# Patient Record
Sex: Female | Born: 1955 | Race: Black or African American | Hispanic: No | Marital: Single | Smoking: Current every day smoker
Health system: Southern US, Community
[De-identification: ages and names within clinical notes are randomized; demographics above are authoritative.]

## PROBLEM LIST (undated history)

## (undated) DIAGNOSIS — F32A Depression, unspecified: Secondary | ICD-10-CM

## (undated) DIAGNOSIS — F329 Major depressive disorder, single episode, unspecified: Secondary | ICD-10-CM

---

## 2005-11-23 ENCOUNTER — Other Ambulatory Visit: Payer: Self-pay

## 2005-11-23 ENCOUNTER — Emergency Department: Payer: Self-pay

## 2006-04-13 ENCOUNTER — Emergency Department: Payer: Self-pay | Admitting: Internal Medicine

## 2006-06-02 ENCOUNTER — Emergency Department: Payer: Self-pay | Admitting: Emergency Medicine

## 2008-09-02 ENCOUNTER — Emergency Department: Payer: Self-pay | Admitting: Internal Medicine

## 2009-02-24 ENCOUNTER — Ambulatory Visit: Payer: Self-pay | Admitting: Internal Medicine

## 2012-06-20 ENCOUNTER — Ambulatory Visit: Payer: Self-pay

## 2013-08-24 ENCOUNTER — Emergency Department: Payer: Self-pay | Admitting: Emergency Medicine

## 2013-08-24 LAB — CBC WITH DIFFERENTIAL/PLATELET
BASOS PCT: 1.1 %
Basophil #: 0.1 10*3/uL (ref 0.0–0.1)
EOS ABS: 0.1 10*3/uL (ref 0.0–0.7)
Eosinophil %: 1.3 %
HCT: 40.3 % (ref 35.0–47.0)
HGB: 13.4 g/dL (ref 12.0–16.0)
LYMPHS PCT: 36.5 %
Lymphocyte #: 2.4 10*3/uL (ref 1.0–3.6)
MCH: 28.3 pg (ref 26.0–34.0)
MCHC: 33.4 g/dL (ref 32.0–36.0)
MCV: 85 fL (ref 80–100)
Monocyte #: 0.4 x10 3/mm (ref 0.2–0.9)
Monocyte %: 5.5 %
NEUTROS PCT: 55.6 %
Neutrophil #: 3.7 10*3/uL (ref 1.4–6.5)
PLATELETS: 307 10*3/uL (ref 150–440)
RBC: 4.76 10*6/uL (ref 3.80–5.20)
RDW: 14.1 % (ref 11.5–14.5)
WBC: 6.7 10*3/uL (ref 3.6–11.0)

## 2013-08-24 LAB — COMPREHENSIVE METABOLIC PANEL
ANION GAP: 6 — AB (ref 7–16)
Albumin: 3.4 g/dL (ref 3.4–5.0)
Alkaline Phosphatase: 94 U/L
BILIRUBIN TOTAL: 0.2 mg/dL (ref 0.2–1.0)
BUN: 12 mg/dL (ref 7–18)
CO2: 27 mmol/L (ref 21–32)
Calcium, Total: 9.3 mg/dL (ref 8.5–10.1)
Chloride: 105 mmol/L (ref 98–107)
Creatinine: 0.97 mg/dL (ref 0.60–1.30)
EGFR (African American): >60
EGFR (Non-African Amer.): >60
GLUCOSE: 82 mg/dL (ref 65–99)
OSMOLALITY: 275 (ref 275–301)
POTASSIUM: 4.1 mmol/L (ref 3.5–5.1)
SGOT(AST): 31 U/L (ref 15–37)
SGPT (ALT): 22 U/L (ref 12–78)
SODIUM: 138 mmol/L (ref 136–145)
TOTAL PROTEIN: 7.9 g/dL (ref 6.4–8.2)

## 2017-05-22 ENCOUNTER — Encounter: Payer: Self-pay | Admitting: Emergency Medicine

## 2017-05-22 ENCOUNTER — Emergency Department: Payer: Medicaid Other

## 2017-05-22 ENCOUNTER — Emergency Department
Admission: EM | Admit: 2017-05-22 | Discharge: 2017-05-22 | Disposition: A | Discharge status: Home / Self Care | Payer: Medicaid Other | Attending: Emergency Medicine | Admitting: Emergency Medicine

## 2017-05-22 ENCOUNTER — Other Ambulatory Visit: Payer: Self-pay

## 2017-05-22 DIAGNOSIS — T50905A Adverse effect of unspecified drugs, medicaments and biological substances, initial encounter: Secondary | ICD-10-CM

## 2017-05-22 DIAGNOSIS — F17200 Nicotine dependence, unspecified, uncomplicated: Secondary | ICD-10-CM | POA: Diagnosis not present

## 2017-05-22 DIAGNOSIS — R400 Somnolence: Secondary | ICD-10-CM

## 2017-05-22 DIAGNOSIS — T43595A Adverse effect of other antipsychotics and neuroleptics, initial encounter: Secondary | ICD-10-CM | POA: Diagnosis not present

## 2017-05-22 DIAGNOSIS — R4781 Slurred speech: Secondary | ICD-10-CM | POA: Insufficient documentation

## 2017-05-22 HISTORY — DX: Major depressive disorder, single episode, unspecified: F32.9

## 2017-05-22 HISTORY — DX: Depression, unspecified: F32.A

## 2017-05-22 LAB — TROPONIN I

## 2017-05-22 LAB — URINALYSIS, COMPLETE (UACMP) WITH MICROSCOPIC
BILIRUBIN URINE: NEGATIVE
Bacteria, UA: NONE SEEN
GLUCOSE, UA: NEGATIVE mg/dL
Hgb urine dipstick: NEGATIVE
KETONES UR: NEGATIVE mg/dL
LEUKOCYTES UA: NEGATIVE
NITRITE: NEGATIVE
PROTEIN: NEGATIVE mg/dL
SQUAMOUS EPITHELIAL / LPF: NONE SEEN
Specific Gravity, Urine: 1.005 (ref 1.005–1.030)
pH: 6 (ref 5.0–8.0)

## 2017-05-22 LAB — SALICYLATE LEVEL: Salicylate Lvl: <7 mg/dL (ref 2.8–30.0)

## 2017-05-22 LAB — COMPREHENSIVE METABOLIC PANEL
ALBUMIN: 4 g/dL (ref 3.5–5.0)
ALT: 15 U/L (ref 14–54)
ANION GAP: 8 (ref 5–15)
AST: 31 U/L (ref 15–41)
Alkaline Phosphatase: 82 U/L (ref 38–126)
BUN: 13 mg/dL (ref 6–20)
CO2: 26 mmol/L (ref 22–32)
Calcium: 9.2 mg/dL (ref 8.9–10.3)
Chloride: 105 mmol/L (ref 101–111)
Creatinine, Ser: 0.85 mg/dL (ref 0.44–1.00)
GFR calc Af Amer: >60 mL/min (ref >60)
GFR calc non Af Amer: >60 mL/min (ref >60)
GLUCOSE: 109 mg/dL — AB (ref 65–99)
POTASSIUM: 4.2 mmol/L (ref 3.5–5.1)
SODIUM: 139 mmol/L (ref 135–145)
TOTAL PROTEIN: 7.5 g/dL (ref 6.5–8.1)
Total Bilirubin: 0.6 mg/dL (ref 0.3–1.2)

## 2017-05-22 LAB — GLUCOSE, CAPILLARY: Glucose-Capillary: 104 mg/dL — ABNORMAL HIGH (ref 65–99)

## 2017-05-22 LAB — URINE DRUG SCREEN, QUALITATIVE (ARMC ONLY)
AMPHETAMINES, UR SCREEN: NOT DETECTED
BENZODIAZEPINE, UR SCRN: NOT DETECTED
Barbiturates, Ur Screen: NOT DETECTED
COCAINE METABOLITE, UR ~~LOC~~: NOT DETECTED
Cannabinoid 50 Ng, Ur ~~LOC~~: NOT DETECTED
MDMA (Ecstasy)Ur Screen: NOT DETECTED
METHADONE SCREEN, URINE: NOT DETECTED
OPIATE, UR SCREEN: NOT DETECTED
Phencyclidine (PCP) Ur S: NOT DETECTED
TRICYCLIC, UR SCREEN: NOT DETECTED

## 2017-05-22 LAB — CBC
HCT: 39.6 % (ref 35.0–47.0)
Hemoglobin: 12.9 g/dL (ref 12.0–16.0)
MCH: 27.6 pg (ref 26.0–34.0)
MCHC: 32.4 g/dL (ref 32.0–36.0)
MCV: 85 fL (ref 80.0–100.0)
Platelets: 308 10*3/uL (ref 150–440)
RBC: 4.66 MIL/uL (ref 3.80–5.20)
RDW: 15 % — AB (ref 11.5–14.5)
WBC: 5.4 10*3/uL (ref 3.6–11.0)

## 2017-05-22 LAB — ETHANOL: Alcohol, Ethyl (B): <10 mg/dL (ref <10)

## 2017-05-22 LAB — ACETAMINOPHEN LEVEL: Acetaminophen (Tylenol), Serum: <10 ug/mL — ABNORMAL LOW (ref 10–30)

## 2017-05-22 NOTE — Discharge Instructions (Addendum)
It was a pleasure to take care of you today, and thank you for coming to our emergency department.  If you have any questions or concerns before leaving please ask the nurse to grab me and I'm more than happy to go through your aftercare instructions again.  If you were prescribed any opioid pain medication today such as Norco, Vicodin, Percocet, morphine, hydrocodone, or oxycodone please make sure you do not drive when you are taking this medication as it can alter your ability to drive safely.  If you have any concerns once you are home that you are not improving or are in fact getting worse before you can make it to your follow-up appointment, please do not hesitate to call 911 and come back for further evaluation.   Results for orders placed or performed during the hospital encounter of 05/22/17  CBC  Result Value Ref Range   WBC 5.4 3.6 - 11.0 K/uL   RBC 4.66 3.80 - 5.20 MIL/uL   Hemoglobin 12.9 12.0 - 16.0 g/dL   HCT 16.1 09.6 - 04.5 %   MCV 85.0 80.0 - 100.0 fL   MCH 27.6 26.0 - 34.0 pg   MCHC 32.4 32.0 - 36.0 g/dL   RDW 40.9 (H) 81.1 - 91.4 %   Platelets 308 150 - 440 K/uL  Comprehensive metabolic panel  Result Value Ref Range   Sodium 139 135 - 145 mmol/L   Potassium 4.2 3.5 - 5.1 mmol/L   Chloride 105 101 - 111 mmol/L   CO2 26 22 - 32 mmol/L   Glucose, Bld 109 (H) 65 - 99 mg/dL   BUN 13 6 - 20 mg/dL   Creatinine, Ser 7.82 0.44 - 1.00 mg/dL   Calcium 9.2 8.9 - 95.6 mg/dL   Total Protein 7.5 6.5 - 8.1 g/dL   Albumin 4.0 3.5 - 5.0 g/dL   AST 31 15 - 41 U/L   ALT 15 14 - 54 U/L   Alkaline Phosphatase 82 38 - 126 U/L   Total Bilirubin 0.6 0.3 - 1.2 mg/dL   GFR calc non Af Amer >60 >60 mL/min   GFR calc Af Amer >60 >60 mL/min   Anion gap 8 5 - 15  Ethanol  Result Value Ref Range   Alcohol, Ethyl (B) <10 <10 mg/dL  Urine Drug Screen, Qualitative ([facility] only)  Result Value Ref Range   Tricyclic, Ur Screen NONE DETECTED NONE DETECTED   Amphetamines, Ur Screen NONE DETECTED  NONE DETECTED   MDMA (Ecstasy)Ur Screen NONE DETECTED NONE DETECTED   Cocaine Metabolite,Ur [place] NONE DETECTED NONE DETECTED   Opiate, Ur Screen NONE DETECTED NONE DETECTED   Phencyclidine (PCP) Ur S NONE DETECTED NONE DETECTED   Cannabinoid 50 Ng, Ur [place] NONE DETECTED NONE DETECTED   Barbiturates, Ur Screen NONE DETECTED NONE DETECTED   Benzodiazepine, Ur Scrn NONE DETECTED NONE DETECTED   Methadone Scn, Ur NONE DETECTED NONE DETECTED  Urinalysis, Complete w Microscopic  Result Value Ref Range   Color, Urine STRAW (A) YELLOW   APPearance CLEAR (A) CLEAR   Specific Gravity, Urine 1.005 1.005 - 1.030   pH 6.0 5.0 - 8.0   Glucose, UA NEGATIVE NEGATIVE mg/dL   Hgb urine dipstick NEGATIVE NEGATIVE   Bilirubin Urine NEGATIVE NEGATIVE   Ketones, ur NEGATIVE NEGATIVE mg/dL   Protein, ur NEGATIVE NEGATIVE mg/dL   Nitrite NEGATIVE NEGATIVE   Leukocytes, UA NEGATIVE NEGATIVE   RBC / HPF 0-5 0 - 5 RBC/hpf   WBC, UA 0-5  0 - 5 WBC/hpf   Bacteria, UA NONE SEEN NONE SEEN   Squamous Epithelial / LPF NONE SEEN NONE SEEN  Salicylate level  Result Value Ref Range   Salicylate Lvl <7.0 2.8 - 30.0 mg/dL  Acetaminophen level  Result Value Ref Range   Acetaminophen (Tylenol), Serum <10 (L) 10 - 30 ug/mL  Troponin I  Result Value Ref Range   Troponin I <0.03 <0.03 ng/mL  Glucose, capillary  Result Value Ref Range   Glucose-Capillary 104 (H) 65 - 99 mg/dL   Ct Head Wo Contrast  Result Date: 05/22/2017 CLINICAL DATA:  Altered level of consciousness. Somnolence and slurred speech. EXAM: CT HEAD WITHOUT CONTRAST TECHNIQUE: Contiguous axial images were obtained from the base of the skull through the vertex without intravenous contrast. COMPARISON:  None. FINDINGS: Brain: Mild diffuse cerebral atrophy. No ventricular dilatation. No evidence of acute infarction, hemorrhage, hydrocephalus, extra-axial collection or mass lesion/mass effect. Vascular: No hyperdense vessel or unexpected calcification. Skull:  Normal. Negative for fracture or focal lesion. Sinuses/Orbits: No acute finding. Other: None. IMPRESSION: 1. No acute intracranial abnormalities.  Mild cortical atrophy. Electronically Signed   By: Burman NievesWilliam  Stevens M.D.   On: 05/22/2017 06:19   Dg Chest Portable 1 View  Result Date: 05/22/2017 CLINICAL DATA:  Cough and some in a once EXAM: PORTABLE CHEST 1 VIEW COMPARISON:  06/01/2016 FINDINGS: No acute airspace disease or pleural effusion. Stable cardiomediastinal silhouette. No pneumothorax. Scoliosis of the spine. IMPRESSION: No active disease. Electronically Signed   By: Jasmine PangKim  Fujinaga M.D.   On: 05/22/2017 00:56

## 2017-05-22 NOTE — ED Provider Notes (Signed)
[facility] Emergency Department Provider Note   First MD Initiated Contact with Patient 05/22/17 0030     (approximate)  I have reviewed the triage vital signs and the nursing notes.   HISTORY  Chief Complaint Medication Reaction    HPI Ruth Brennan is a 62 y.o. female presents to the emergency department via EMS secondary to somnolence slurred speech.  Patient was [facility] today and prescribed Seroquel 100 mg which did patient states that she took her first dose tonight.  Patient denies any other substance ingestion.  Patient states difficulty trying to stay awake.  Patient denies any weakness no numbness no visual changes.   Past Medical History:  Diagnosis Date  . Depression     There are no active problems to display for this patient.   Past Surgical History:  Procedure Laterality Date  . CESAREAN SECTION      Prior to Admission medications   Not on File    Allergies Penicillins  No family history on file.  Social History Social History   Tobacco Use  . Smoking status: Current Every Day Smoker  . Smokeless tobacco: Never Used  Substance Use Topics  . Alcohol use: Yes  . Drug use: Not on file    Review of Systems Constitutional: No fever/chills Eyes: No visual changes. ENT: No sore throat. Cardiovascular: Denies chest pain. Respiratory: Denies shortness of breath. Gastrointestinal: No abdominal pain.  No nausea, no vomiting.  No diarrhea.  No constipation. Genitourinary: Negative for dysuria. Musculoskeletal: Negative for neck pain.  Negative for back pain. Integumentary: Negative for rash. Neurological: Negative for headaches, focal weakness or numbness.  Positive for somnolence  ____________________________________________   PHYSICAL EXAM:  VITAL SIGNS: ED Triage Vitals [05/22/17 0019]  Enc Vitals Group     BP 123/84     Pulse Rate 66     Resp 18     Temp      Temp src      SpO2 100 %     Weight  52.2 kg (115 lb)     Height 1.651 m (5\' 5" )     Head Circumference      Peak Flow      Pain Score 0     Pain Loc      Pain Edu?      Excl. in GC?     Constitutional: Very somnolent but arousable to verbal stimuli.  Patient repetitively falling asleep eyes: Conjunctivae are normal. PERRL. EOMI. Head: Atraumatic. Mouth/Throat: Mucous membranes are moist.  Oropharynx non-erythematous. Neck: No stridor.  Cardiovascular: Normal rate, regular rhythm. Good peripheral circulation. Grossly normal heart sounds. Respiratory: Normal respiratory effort.  No retractions. Lungs CTAB. Gastrointestinal: Soft and nontender. No distention.  Musculoskeletal: No lower extremity tenderness nor edema. No gross deformities of extremities. Neurologic:  Normal speech and language. No gross focal neurologic deficits are appreciated.  Skin:  Skin is warm, dry and intact. No rash noted. Psychiatric: Mood and affect are normal. Speech and behavior are normal.  ____________________________________________   LABS (all labs ordered are listed, but only abnormal results are displayed)  Labs Reviewed  CBC - Abnormal; Notable for the following components:      Result Value   RDW 15.0 (*)    All other components within normal limits  COMPREHENSIVE METABOLIC PANEL - Abnormal; Notable for the following components:   Glucose, Bld 109 (*)    All other components within normal limits  URINALYSIS, COMPLETE (UACMP) WITH  MICROSCOPIC - Abnormal; Notable for the following components:   Color, Urine STRAW (*)    APPearance CLEAR (*)    All other components within normal limits  ACETAMINOPHEN LEVEL - Abnormal; Notable for the following components:   Acetaminophen (Tylenol), Serum <10 (*)    All other components within normal limits  GLUCOSE, CAPILLARY - Abnormal; Notable for the following components:   Glucose-Capillary 104 (*)    All other components within normal limits  ETHANOL  URINE DRUG SCREEN, QUALITATIVE ([facility]  ONLY)  SALICYLATE LEVEL  TROPONIN I  CBG MONITORING, ED   ____________________________________________  EKG  ED ECG REPORT I, [place] N Antwoine Zorn, the attending physician, personally viewed and interpreted this ECG.   Date: 05/22/2017  EKG Time: 12:21 AM  Rate: 62  Rhythm: Normal sinus rhythm  Axis: Normal  Intervals: Normal  ST&T Change: None  ____________________________________________  RADIOLOGY I, [place] N Rondo Spittler, personally viewed and evaluated these images (plain radiographs) as part of my medical decision making, as well as reviewing the written report by the radiologist.  ED MD interpretation: Chest x-ray unremarkable likewise CT scan of the head revealed no acute intracranial abnormalities as per radiologist.  Official radiology report(s): Ct Head Wo Contrast  Result Date: 05/22/2017 CLINICAL DATA:  Altered level of consciousness. Somnolence and slurred speech. EXAM: CT HEAD WITHOUT CONTRAST TECHNIQUE: Contiguous axial images were obtained from the base of the skull through the vertex without intravenous contrast. COMPARISON:  None. FINDINGS: Brain: Mild diffuse cerebral atrophy. No ventricular dilatation. No evidence of acute infarction, hemorrhage, hydrocephalus, extra-axial collection or mass lesion/mass effect. Vascular: No hyperdense vessel or unexpected calcification. Skull: Normal. Negative for fracture or focal lesion. Sinuses/Orbits: No acute finding. Other: None. IMPRESSION: 1. No acute intracranial abnormalities.  Mild cortical atrophy. Electronically Signed   By: Burman Nieves M.D.   On: 05/22/2017 06:19   Dg Chest Portable 1 View  Result Date: 05/22/2017 CLINICAL DATA:  Cough and some in a once EXAM: PORTABLE CHEST 1 VIEW COMPARISON:  06/01/2016 FINDINGS: No acute airspace disease or pleural effusion. Stable cardiomediastinal silhouette. No pneumothorax. Scoliosis of the spine. IMPRESSION: No active disease. Electronically Signed   By: Jasmine Pang M.D.   On:  05/22/2017 00:56    Procedures   ____________________________________________   INITIAL IMPRESSION / ASSESSMENT AND PLAN / ED COURSE  As part of my medical decision making, I reviewed the following data within the electronic MEDICAL RECORD NUMBER   62 year old female presented with above-stated history and physical exam which I suspect to be secondary to Seroquel ingestion.  However consider the possibility of toxin mediated etiology however laboratory data completely unremarkable.  CT scan of the head was obtained to evaluate for intracranial abnormality including hemorrhage/CVA however CT was also normal.  As such I suspect the patient's somnolence is secondary to Seroquel patient will be allowed to sleep in the emergency department reevaluated before discharge.  __________________________________  FINAL CLINICAL IMPRESSION(S) / ED DIAGNOSES  Final diagnoses:  Somnolence  Medication reaction, initial encounter     MEDICATIONS GIVEN DURING THIS VISIT:  Medications - No data to display   ED Discharge Orders    None       Note:  This document was prepared using Dragon voice recognition software and may include unintentional dictation errors.    Darci Current, MD 05/22/17 [phone]

## 2017-05-22 NOTE — ED Triage Notes (Addendum)
Pt to room 3 via w/c, brought in by EMS due to somnlence; pt with eyes closed, slurred speech; EMS st seen [facility] today and rx seroquel 100mg ; pt st that she took it at 3pm and denies any other meds or substances taken as well; pt falling asleep freq during triage; placed in hosp gown & on card monitor; pt st "when I laid down I thought I was gonna die"

## 2017-05-22 NOTE — ED Notes (Addendum)
Pt awake and alert, pt drinking water at this time. PT in NAD , VSS . Family at bedside

## 2017-05-22 NOTE — ED Notes (Signed)
Patient transported to CT 

## 2017-05-22 NOTE — ED Notes (Signed)
Patient is resting comfortably at this time with no signs of distress present. Equal, unlabored rise and fall of chest noted within normal rate. VS stable. Family remains at bedside. Will continue to monitor.

## 2019-05-18 ENCOUNTER — Ambulatory Visit: Payer: Medicaid Other | Attending: Internal Medicine

## 2019-05-18 DIAGNOSIS — Z23 Encounter for immunization: Secondary | ICD-10-CM

## 2019-05-18 NOTE — Progress Notes (Signed)
   Covid-19 Vaccination Clinic  Name:  Ruth Brennan    MRN: [id number] DOB: April 26, 1955  05/18/2019  [name] was observed post Covid-19 immunization for 15 minutes without incident. She was provided with Vaccine Information Sheet and instruction to access the V-Safe system.   [name] was instructed to call 911 with any severe reactions post vaccine: Marland Kitchen Difficulty breathing  . Swelling of face and throat  . A fast heartbeat  . A bad rash all over body  . Dizziness and weakness   Immunizations Administered    Name Date Dose VIS Date Route   Pfizer COVID-19 Vaccine 05/18/2019 12:07 PM 0.3 mL 01/30/2019 Intramuscular   Manufacturer: [employer], [employer]   Lot: [id number]   NDC: [address]-9

## 2019-06-09 ENCOUNTER — Ambulatory Visit: Payer: Medicaid Other | Attending: Internal Medicine

## 2019-06-09 DIAGNOSIS — Z23 Encounter for immunization: Secondary | ICD-10-CM

## 2019-06-09 NOTE — Progress Notes (Signed)
   Covid-19 Vaccination Clinic  Name:  Ruth Brennan    MRN: [id number] DOB: Aug 23, 1955  06/09/2019  [name] was observed post Covid-19 immunization for 30 minutes based on pre-vaccination screening without incident. She was provided with Vaccine Information Sheet and instruction to access the V-Safe system.   [name] was instructed to call 911 with any severe reactions post vaccine: Marland Kitchen Difficulty breathing  . Swelling of face and throat  . A fast heartbeat  . A bad rash all over body  . Dizziness and weakness   Immunizations Administered    Name Date Dose VIS Date Route   Pfizer COVID-19 Vaccine 06/09/2019 12:59 PM 0.3 mL 04/15/2018 Intramuscular   Manufacturer: [employer], [employer]   Lot: [id number]   NDC: [address]-3

## 2019-06-29 IMAGING — DX DG CHEST 1V PORT
1 series · 1 of 1 positions shown · non-contrast
Comparison: 06/01/2016

CLINICAL DATA: Cough and some in a once

EXAM:
PORTABLE CHEST 1 VIEW

[chest ap]
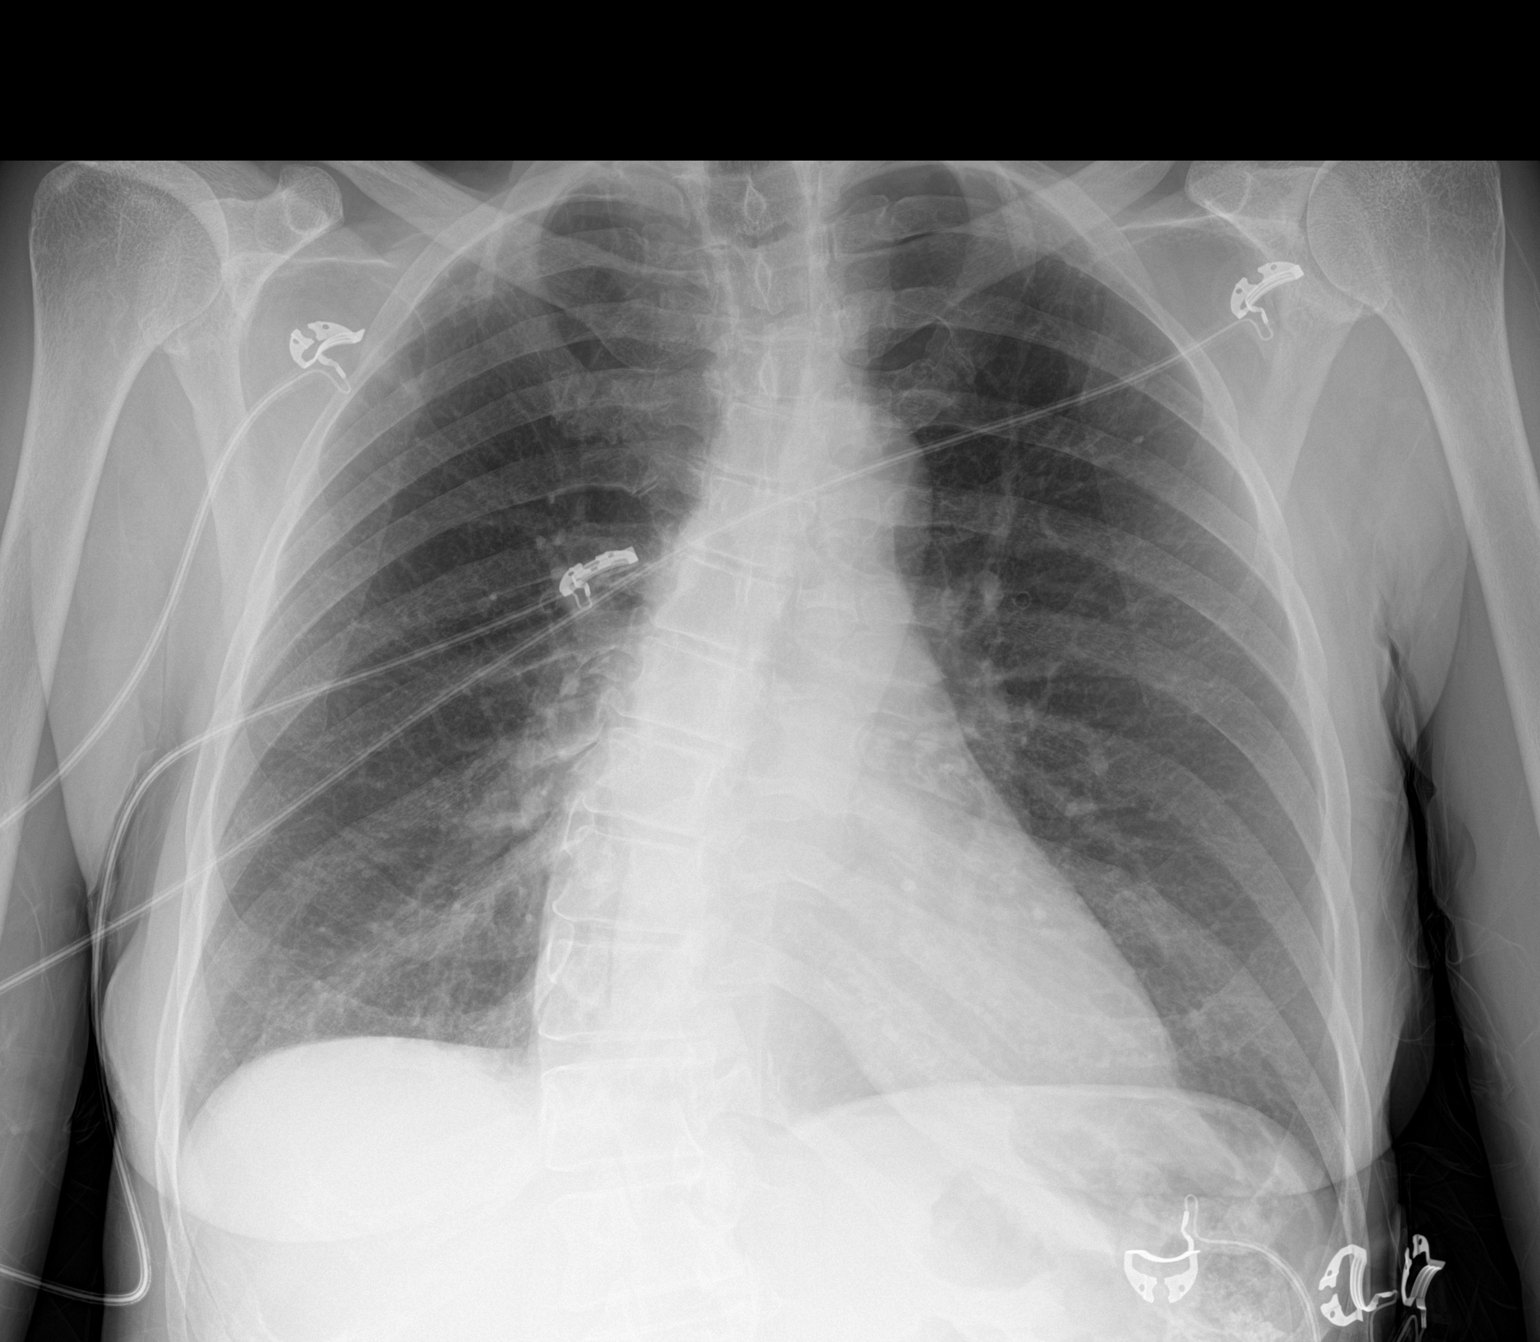

[1 of 1 positions shown; findings below may reference images not displayed]

FINDINGS: No acute airspace disease or pleural effusion. Stable
cardiomediastinal silhouette. No pneumothorax. Scoliosis of the
spine.
IMPRESSION: No active disease.

## 2019-06-29 IMAGING — CT CT HEAD W/O CM
3 series · 16 of 45 positions shown, 19 images · non-contrast
Comparison: None.

CLINICAL DATA: Altered level of consciousness. Somnolence and
slurred speech.

EXAM:
CT HEAD WITHOUT CONTRAST
TECHNIQUE: Contiguous axial images were obtained from the base of the skull
through the vertex without intravenous contrast.

[Series 2: head wo · axial · 0.39mm/px · z∈[-106,+9]mm · 10 of 28 slices shown, 13 images]
[im 3/28  brain]
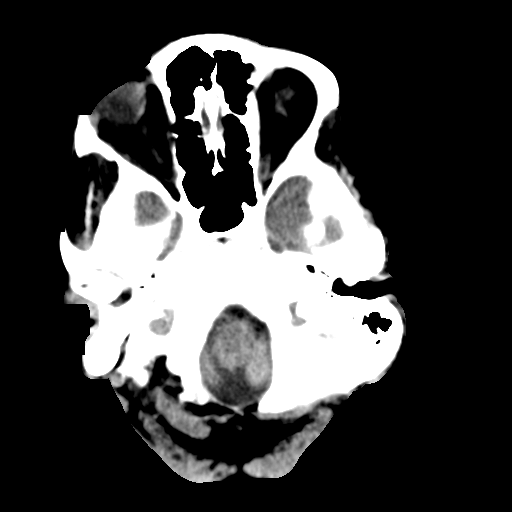
[im 3/28  bone]
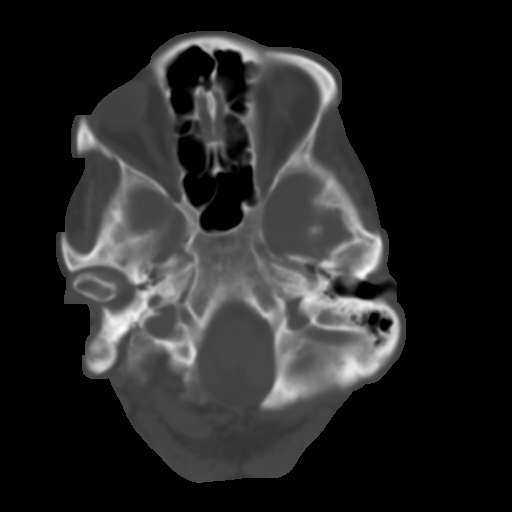
[im 5/28  brain]
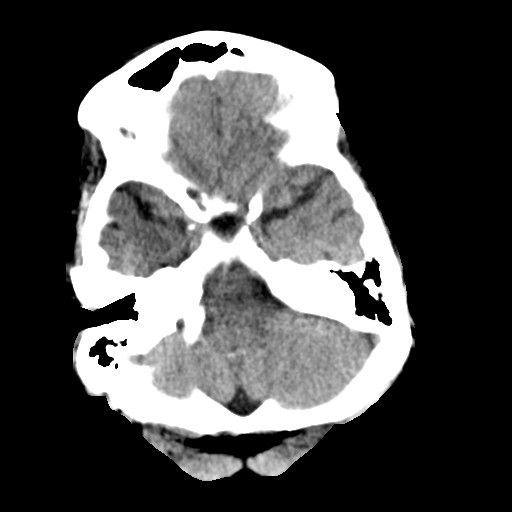
[im 8/28  brain]
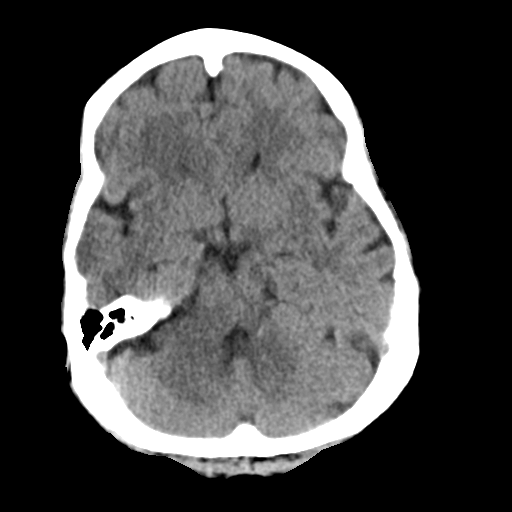
[im 11/28  brain]
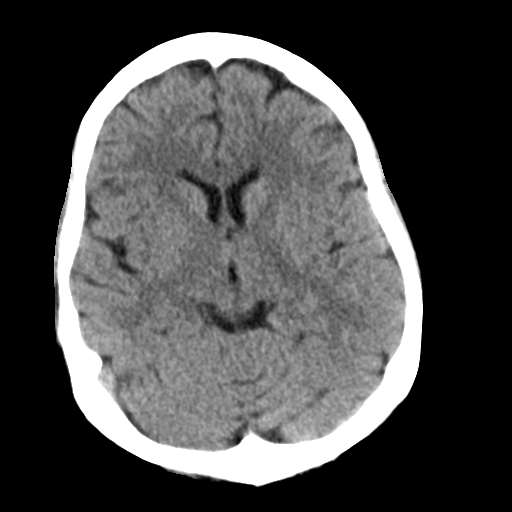
[im 13/28  brain]
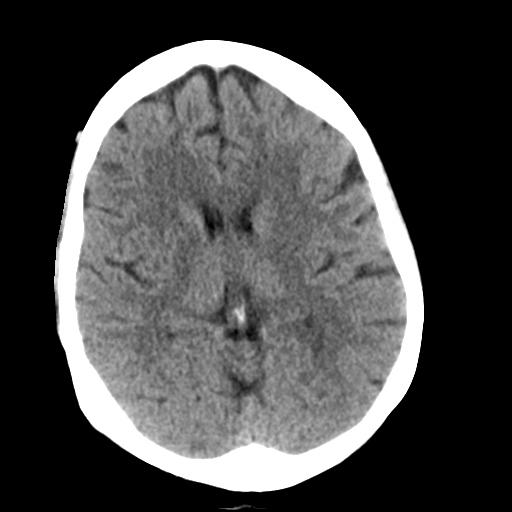
[im 13/28  bone]
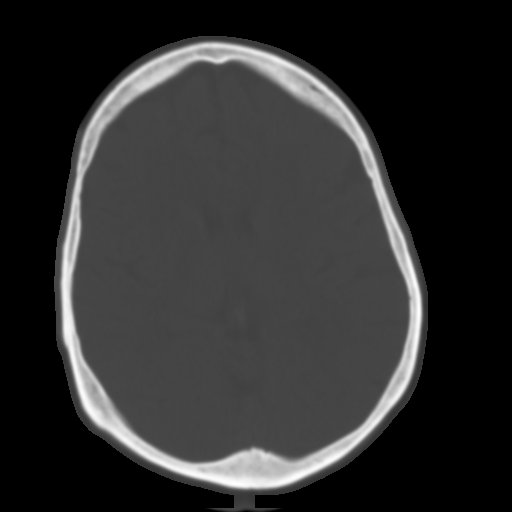
[im 16/28  brain]
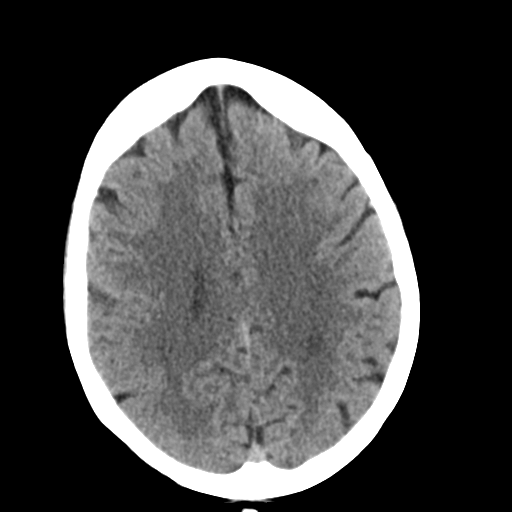
[im 18/28  brain]
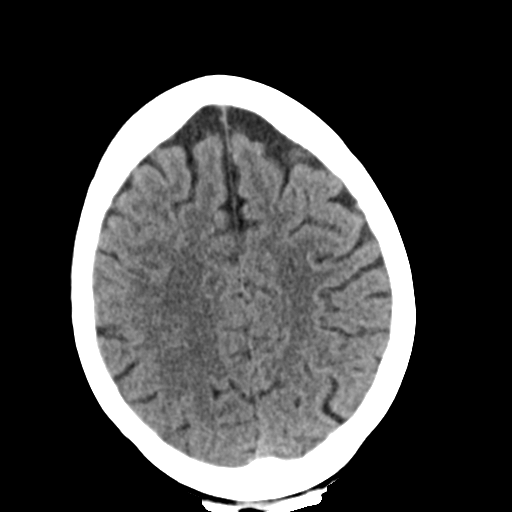
[im 21/28  brain]
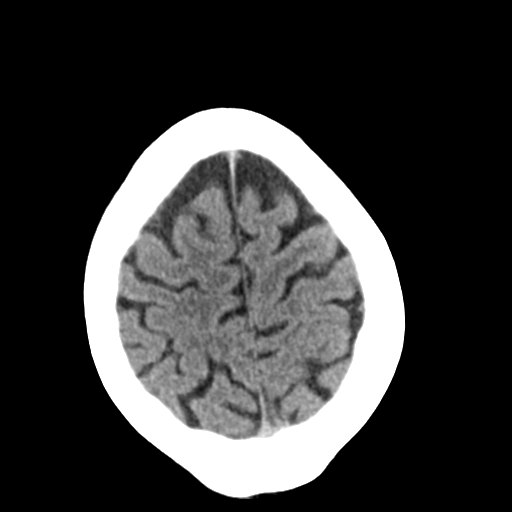
[im 24/28  brain]
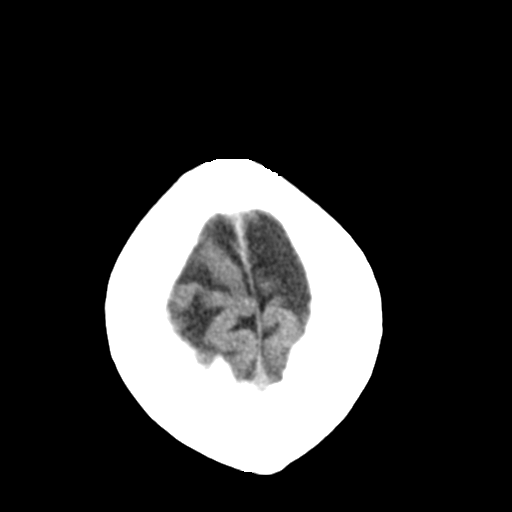
[im 24/28  bone]
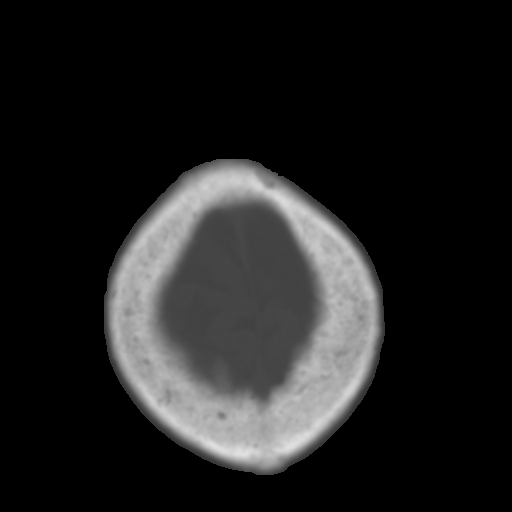
[im 26/28  brain]
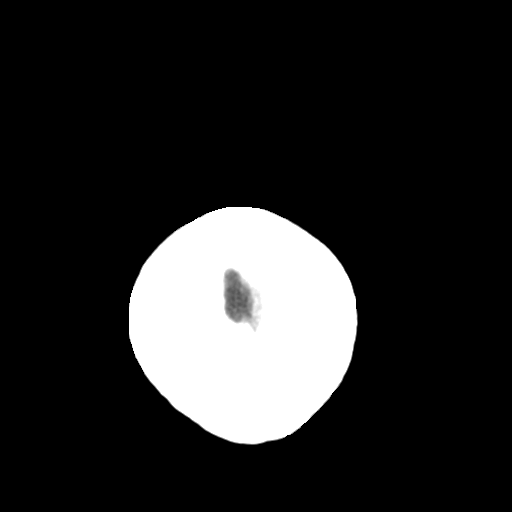

[Series 4: coronal soft tissue · coronal · 0.29mm/px · 3 of 60 slices shown]
[im 20/60  brain]
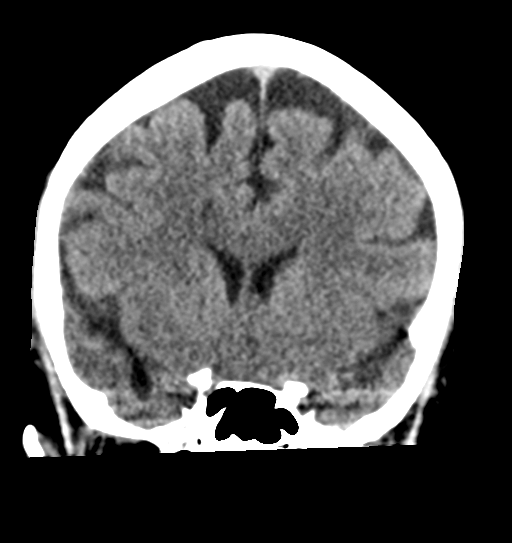
[im 27/60  brain]
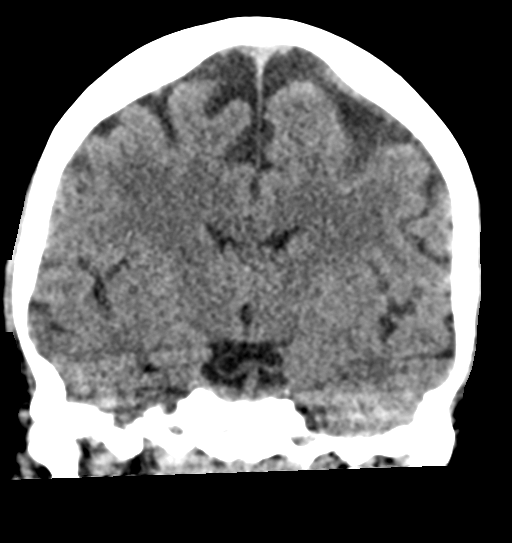
[im 33/60  brain]
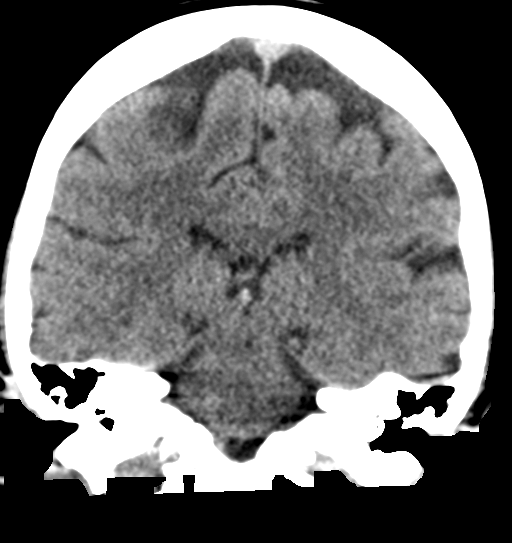

[Series 5: sagittal soft tissue · sagittal · 0.30mm/px · 3 of 45 slices shown]
[im 15/45  brain]
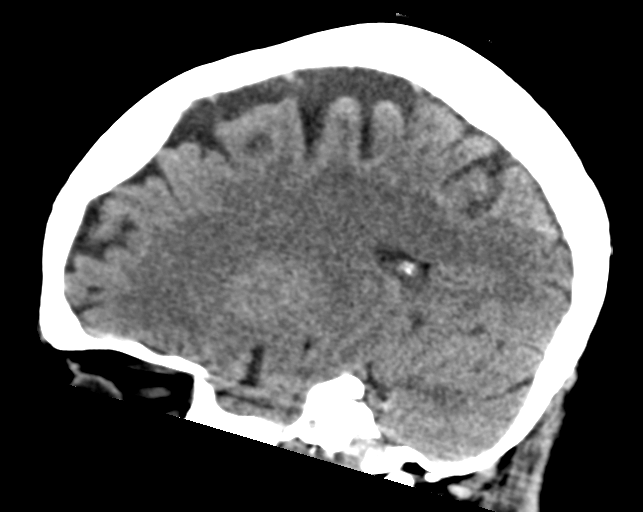
[im 23/45  brain]
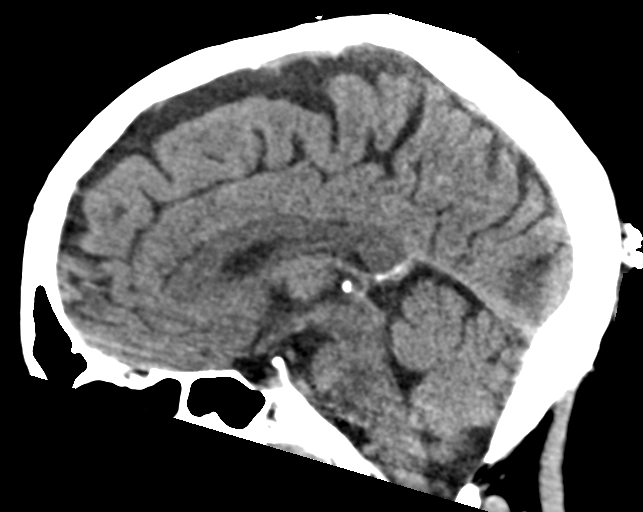
[im 30/45  brain]
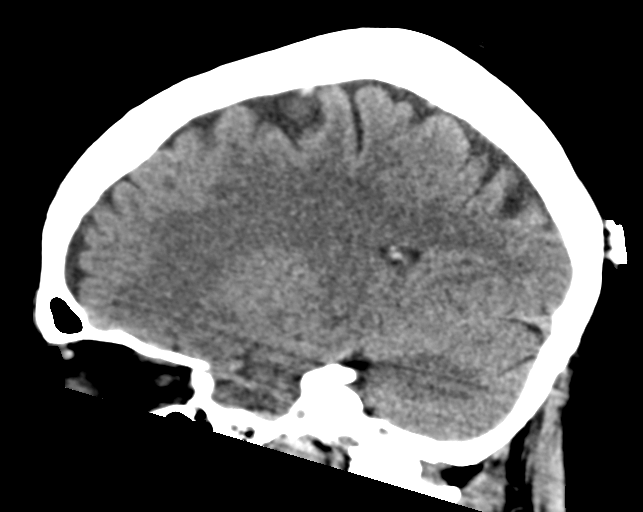

[16 of 45 positions shown; findings below may reference images not displayed]

FINDINGS: Brain: Mild diffuse cerebral atrophy. No ventricular dilatation. No
evidence of acute infarction, hemorrhage, hydrocephalus, extra-axial
collection or mass lesion/mass effect.

Vascular: No hyperdense vessel or unexpected calcification.

Skull: Normal. Negative for fracture or focal lesion.

Sinuses/Orbits: No acute finding.

Other: None.
IMPRESSION: 1. No acute intracranial abnormalities.  Mild cortical atrophy.

## 2022-04-25 NOTE — Progress Notes (Deleted)
New patient visit   Patient: Ruth Brennan   DOB: [birth year]   67 y.o. Female  MRN: [id number] Visit Date: 04/26/2022  Today's healthcare provider: Eulis Foster, MD   No chief complaint on file.  Subjective    [name] is a 67 y.o. female who presents today as a new patient to establish care.  HPI   Encounter to Establish Care Patient presents to establish care  Introduced myself and my role as primary care physician  We reviewed patient's medical, surgical, and social history  Reviewed patient's current medications   Additional problems were discussed as detailed below  Depression: current medications prescribed include abilify '5mg'$   HTN: prescribed amlodipine '5mg'$    HLD: on crestor '20mg'$    Past Medical History:  Diagnosis Date   Depression    Past Surgical History:  Procedure Laterality Date   CESAREAN SECTION     No family status information on file.   No family history on file. Social History   Socioeconomic History   Marital status: Single    Spouse name: Not on file   Number of children: Not on file   Years of education: Not on file   Highest education level: Not on file  Occupational History   Not on file  Tobacco Use   Smoking status: Every Day   Smokeless tobacco: Never  Substance and Sexual Activity   Alcohol use: Yes   Drug use: Not on file   Sexual activity: Not on file  Other Topics Concern   Not on file  Social History Narrative   Not on file   Social Determinants of Health   Financial Resource Strain: Not on file  Food Insecurity: Not on file  Transportation Needs: Not on file  Physical Activity: Not on file  Stress: Not on file  Social Connections: Not on file   No outpatient medications prior to visit.   No facility-administered medications prior to visit.   Allergies  Allergen Reactions   Penicillins     Immunization History  Administered Date(s) Administered   PFIZER(Purple Top)SARS-COV-2 Vaccination  05/18/2019, 06/09/2019, 02/09/2020    Health Maintenance  Topic Date Due   Medicare Annual Wellness (AWV)  Never done   Pneumonia Vaccine 80+ Years old (1 of 2 - PCV) Never done   HIV Screening  Never done   Hepatitis C Screening  Never done   DTaP/Tdap/Td (1 - Tdap) Never done   PAP SMEAR-Modifier  Never done   COLONOSCOPY (Pts 45-59yr Insurance coverage will need to be confirmed)  Never done   MAMMOGRAM  Never done   Zoster Vaccines- Shingrix (1 of 2) Never done   INFLUENZA VACCINE  09/19/2021   DEXA SCAN  Never done   COVID-19 Vaccine (4 - 2023-24 season) 10/20/2021   HPV VACCINES  Aged Out    Patient Care Team: WGlenice Bow DO (Inactive) as PCP - General (Family Medicine)  Review of Systems  {Labs  Heme  Chem  Endocrine  Serology  Results Review (optional):23779}   Objective    There were no vitals taken for this visit. {Show previous vital signs (optional):23777}  Physical Exam ***  Depression Screen     No data to display         No results found for any visits on 04/26/22.  Assessment & Plan      Problem List Items Addressed This Visit   None   No follow-ups on file.       The  entirety of the information documented in the History of Present Illness, Review of Systems and Physical Exam were personally obtained by me. Portions of this information were initially documented by *** . I, Eulis Foster, MD have reviewed the documentation above for thoroughness and accuracy.      Eulis Foster, MD  [facility] [phone] (phone) [phone] (fax)  [place]

## 2022-04-26 ENCOUNTER — Ambulatory Visit: Payer: 59 | Admitting: Family Medicine

## 2022-07-11 ENCOUNTER — Ambulatory Visit: Payer: 59 | Admitting: Family Medicine
